# Patient Record
Sex: Female | Born: 1950 | Race: Black or African American | Hispanic: No | Smoking: Never smoker
Health system: Southern US, Community
[De-identification: ages and names within clinical notes are randomized; demographics above are authoritative.]

## PROBLEM LIST (undated history)

## (undated) DIAGNOSIS — E119 Type 2 diabetes mellitus without complications: Secondary | ICD-10-CM

## (undated) DIAGNOSIS — I10 Essential (primary) hypertension: Secondary | ICD-10-CM

---

## 2018-08-08 ENCOUNTER — Encounter (HOSPITAL_BASED_OUTPATIENT_CLINIC_OR_DEPARTMENT_OTHER): Payer: Self-pay | Admitting: *Deleted

## 2018-08-08 ENCOUNTER — Other Ambulatory Visit: Payer: Self-pay

## 2018-08-08 ENCOUNTER — Emergency Department (HOSPITAL_BASED_OUTPATIENT_CLINIC_OR_DEPARTMENT_OTHER)
Admission: EM | Admit: 2018-08-08 | Discharge: 2018-08-08 | Disposition: A | Discharge status: Home / Self Care | Payer: Medicare Other | Attending: Emergency Medicine | Admitting: Emergency Medicine

## 2018-08-08 DIAGNOSIS — R1013 Epigastric pain: Secondary | ICD-10-CM | POA: Diagnosis not present

## 2018-08-08 DIAGNOSIS — E119 Type 2 diabetes mellitus without complications: Secondary | ICD-10-CM | POA: Insufficient documentation

## 2018-08-08 DIAGNOSIS — Z7984 Long term (current) use of oral hypoglycemic drugs: Secondary | ICD-10-CM | POA: Insufficient documentation

## 2018-08-08 DIAGNOSIS — Z79899 Other long term (current) drug therapy: Secondary | ICD-10-CM | POA: Insufficient documentation

## 2018-08-08 DIAGNOSIS — I10 Essential (primary) hypertension: Secondary | ICD-10-CM | POA: Diagnosis not present

## 2018-08-08 DIAGNOSIS — R220 Localized swelling, mass and lump, head: Secondary | ICD-10-CM | POA: Diagnosis not present

## 2018-08-08 HISTORY — DX: Type 2 diabetes mellitus without complications: E11.9

## 2018-08-08 HISTORY — DX: Essential (primary) hypertension: I10

## 2018-08-08 LAB — CBC WITH DIFFERENTIAL/PLATELET
Abs Immature Granulocytes: 0.07 10*3/uL (ref 0.00–0.07)
Basophils Absolute: 0 10*3/uL (ref 0.0–0.1)
Basophils Relative: 0 %
Eosinophils Absolute: 0.1 10*3/uL (ref 0.0–0.5)
Eosinophils Relative: 2 %
HCT: 33.4 % — ABNORMAL LOW (ref 36.0–46.0)
Hemoglobin: 11.3 g/dL — ABNORMAL LOW (ref 12.0–15.0)
Immature Granulocytes: 1 %
Lymphocytes Relative: 20 %
Lymphs Abs: 1.2 10*3/uL (ref 0.7–4.0)
MCH: 27.1 pg (ref 26.0–34.0)
MCHC: 33.8 g/dL (ref 30.0–36.0)
MCV: 80.1 fL (ref 80.0–100.0)
Monocytes Absolute: 0.7 10*3/uL (ref 0.1–1.0)
Monocytes Relative: 11 %
Neutro Abs: 4.3 10*3/uL (ref 1.7–7.7)
Neutrophils Relative %: 66 %
Platelets: 704 10*3/uL — ABNORMAL HIGH (ref 150–400)
RBC: 4.17 MIL/uL (ref 3.87–5.11)
RDW: 11.7 % (ref 11.5–15.5)
WBC: 6.4 10*3/uL (ref 4.0–10.5)
nRBC: 0 % (ref 0.0–0.2)

## 2018-08-08 LAB — COMPREHENSIVE METABOLIC PANEL
ALT: 29 U/L (ref 0–44)
AST: 25 U/L (ref 15–41)
Albumin: 3.5 g/dL (ref 3.5–5.0)
Alkaline Phosphatase: 134 U/L — ABNORMAL HIGH (ref 38–126)
Anion gap: 14 (ref 5–15)
BUN: 28 mg/dL — ABNORMAL HIGH (ref 8–23)
CO2: 20 mmol/L — ABNORMAL LOW (ref 22–32)
Calcium: 9 mg/dL (ref 8.9–10.3)
Chloride: 98 mmol/L (ref 98–111)
Creatinine, Ser: 1.37 mg/dL — ABNORMAL HIGH (ref 0.44–1.00)
GFR calc Af Amer: 46 mL/min — ABNORMAL LOW (ref >60)
GFR calc non Af Amer: 40 mL/min — ABNORMAL LOW (ref >60)
Glucose, Bld: 126 mg/dL — ABNORMAL HIGH (ref 70–99)
Potassium: 3.2 mmol/L — ABNORMAL LOW (ref 3.5–5.1)
Sodium: 132 mmol/L — ABNORMAL LOW (ref 135–145)
Total Bilirubin: 0.6 mg/dL (ref 0.3–1.2)
Total Protein: 7.8 g/dL (ref 6.5–8.1)

## 2018-08-08 LAB — LIPASE, BLOOD: Lipase: 42 U/L (ref 11–51)

## 2018-08-08 MED ORDER — POTASSIUM CHLORIDE CRYS ER 20 MEQ PO TBCR
40.0000 meq | EXTENDED_RELEASE_TABLET | Freq: Once | ORAL | Status: AC
  Administered 2018-08-08: 21:00:00 40 meq via ORAL
  Filled 2018-08-08: qty 2

## 2018-08-08 MED ORDER — DIPHENHYDRAMINE HCL 25 MG PO CAPS
25.0000 mg | ORAL_CAPSULE | Freq: Once | ORAL | Status: AC
  Administered 2018-08-08: 19:00:00 25 mg via ORAL
  Filled 2018-08-08: qty 1

## 2018-08-08 MED ORDER — ALUM & MAG HYDROXIDE-SIMETH 200-200-20 MG/5ML PO SUSP
30.0000 mL | Freq: Once | ORAL | Status: AC
  Administered 2018-08-08: 20:00:00 30 mL via ORAL
  Filled 2018-08-08: qty 30

## 2018-08-08 MED ORDER — LIDOCAINE VISCOUS HCL 2 % MT SOLN
15.0000 mL | Freq: Once | OROMUCOSAL | Status: AC
  Administered 2018-08-08: 15 mL via ORAL
  Filled 2018-08-08: qty 15

## 2018-08-08 NOTE — Discharge Instructions (Addendum)
You may take Benadryl for the swelling.  If you develop trouble breathing, inability to swallow, swelling of your tongue, or any other new/concerning symptoms then return to the ER for evaluation.  IMPORTANT PATIENT INSTRUCTIONS:  You have been scheduled for an Outpatient Ultrasound.    Your appointment has been scheduled for:  _______ am/pm on _______________ (date).  If your appointment is scheduled for a Saturday, Sunday or holiday, please go to the [facility] Emergency Department Registration Desk at least 15 minutes prior to your appointment time and tell them you are there for an ultrasound.    If your appointment is scheduled for a weekday (Monday - Friday), please go directly to the [facility] Radiology Department reception area at least 15 minutes prior to your appointment time and tell them you are there for an ultrasound.    Please call [phone] with questions.

## 2018-08-08 NOTE — ED Provider Notes (Addendum)
[facility] EMERGENCY DEPARTMENT Provider Note   CSN: [id number] Arrival date & time: 08/08/18  1833    History   Chief Complaint No chief complaint on file.   HPI Denise House is a 68 y.o. female.     HPI  68 year old female presents with facial swelling starting this morning.  She states that when she first woke up it felt like her face is swollen and has moved into her lips.  There is no tongue swelling.  No trouble breathing.  No rash anywhere.  She feels like it is hard to swallow and is worried about her thyroid.  She states she is had some thyroid swelling on the left side and has seen a physician about this but told there would be no indication for surgery unless it got worse or cause trouble swallowing.  The patient states that she has been trying to start her reflux medicine but just picked it up a couple days ago.  She is not sure if this is medication related.  She is been having a bad GERD over the last 1 week since she ate onions and hamburger.  She states she is getting a lot of epigastric pain that radiates to her chest and feels like a burning.  No chest pain or shortness of breath.  No vomiting.  Past Medical History:  Diagnosis Date  . Diabetes mellitus without complication (HCC)   . Hypertension     There are no active problems to display for this patient.   History reviewed. No pertinent surgical history.   OB History   No obstetric history on file.      Home Medications    Prior to Admission medications   Medication Sig Start Date End Date Taking? Authorizing Provider  atorvastatin (LIPITOR) 20 MG tablet TK 1 T PO QHS 08/05/18  Yes [provider]  esomeprazole (NEXIUM) 20 MG capsule TK ONE C PO QD AT LEAST 1 HOUR AC SWALLOWING WHOLE. DO NOT CRUSH OR CHEW GRANULES. 07/29/18  Yes [provider]  esomeprazole (NEXIUM) 40 MG capsule  08/06/18  Yes [provider]  fluticasone (FLONASE) 50 MCG/ACT nasal spray SHAKE  LQ AND U 1 SPR IEN BID 07/29/18  Yes [provider]  lisinopril-hydrochlorothiazide (ZESTORETIC) 20-25 MG tablet TK 1 T PO QD 07/09/18  Yes [provider]  metFORMIN (GLUCOPHAGE) 500 MG tablet TK 1 T PO BID WITH MORNING AND EVENING MEALS 08/05/18  Yes [provider]  PROAIR HFA 108 (90 Base) MCG/ACT inhaler INL 2 PFS PO Q 4 H PRN 07/09/18  Yes [provider]    Family History No family history on file.  Social History Social History   Tobacco Use  . Smoking status: Never Smoker  . Smokeless tobacco: Never Used  Substance Use Topics  . Alcohol use: Never    Frequency: Never  . Drug use: Never     Allergies   Aspirin; Codeine; and Sulfa antibiotics   Review of Systems Review of Systems  Constitutional: Negative for fever.  HENT: Positive for facial swelling and trouble swallowing. Negative for sore throat.   Respiratory: Negative for shortness of breath.   Cardiovascular: Negative for chest pain.  Gastrointestinal: Positive for abdominal pain. Negative for nausea and vomiting.  All other systems reviewed and are negative.    Physical Exam Updated Vital Signs BP 128/62 (BP Location: Left Arm)   Pulse 84   Temp 98.2 F (36.8 C) (Oral)   Resp 16  Ht 5\' 2"  (1.575 m)   Wt 69.9 kg   SpO2 100%   BMI 28.17 kg/m   Physical Exam Vitals signs and nursing note reviewed.  Constitutional:      General: She is not in acute distress.    Appearance: She is well-developed. She is not ill-appearing or diaphoretic.  HENT:     Head: Normocephalic and atraumatic.     Right Ear: External ear normal.     Left Ear: External ear normal.     Nose: Nose normal.     Mouth/Throat:     Pharynx: No oropharyngeal exudate or posterior oropharyngeal erythema.     Comments: Uvula midline, no swelling. No tongue swelling. Normal phonation Perhaps mild facial swelling of lips and cheeks/chin. No skin color changes Eyes:     General:        Right eye: No  discharge.        Left eye: No discharge.  Cardiovascular:     Rate and Rhythm: Normal rate and regular rhythm.     Heart sounds: Normal heart sounds.  Pulmonary:     Effort: Pulmonary effort is normal.     Breath sounds: Normal breath sounds. No stridor. No wheezing.  Abdominal:     Palpations: Abdomen is soft.     Tenderness: There is abdominal tenderness in the epigastric area.  Skin:    General: Skin is warm and dry.  Neurological:     Mental Status: She is alert.  Psychiatric:        Mood and Affect: Mood is not anxious.      ED Treatments / Results  Labs (all labs ordered are listed, but only abnormal results are displayed) Labs Reviewed  COMPREHENSIVE METABOLIC PANEL - Abnormal; Notable for the following components:      Result Value   Sodium 132 (*)    Potassium 3.2 (*)    CO2 20 (*)    Glucose, Bld 126 (*)    BUN 28 (*)    Creatinine, Ser 1.37 (*)    Alkaline Phosphatase 134 (*)    GFR calc non Af Amer 40 (*)    GFR calc Af Amer 46 (*)    All other components within normal limits  CBC WITH DIFFERENTIAL/PLATELET - Abnormal; Notable for the following components:   Hemoglobin 11.3 (*)    HCT 33.4 (*)    Platelets 704 (*)    All other components within normal limits  LIPASE, BLOOD    EKG EKG Interpretation  Date/Time:  Thursday August 08 2018 19:32:24 EDT Ventricular Rate:  87 PR Interval:    QRS Duration: 95 QT Interval:  372 QTC Calculation: 448 R Axis:   34 Text Interpretation:  Sinus rhythm RSR' in V1 or V2, probably normal variant no acute ST/T changes No old tracing to compare Confirmed by Pricilla Loveless [phone]) on 08/08/2018 7:42:42 PM   Radiology No results found.  Procedures Procedures (including critical care time)  Medications Ordered in ED Medications  diphenhydrAMINE (BENADRYL) capsule 25 mg (25 mg Oral Given 08/08/18 1929)  alum & mag hydroxide-simeth (MAALOX/MYLANTA) 200-200-20 MG/5ML suspension 30 mL (30 mLs Oral Given 08/08/18  1930)    And  lidocaine (XYLOCAINE) 2 % viscous mouth solution 15 mL (15 mLs Oral Given 08/08/18 1930)  potassium chloride SA (K-DUR) CR tablet 40 mEq (40 mEq Oral Given 08/08/18 2043)     Initial Impression / Assessment and Plan / ED Course  I have reviewed the triage vital signs  and the nursing notes.  Pertinent labs & imaging results that were available during my care of the patient were reviewed by me and considered in my medical decision making (see chart for details).        Unclear cause of the patient's mild facial swelling.  There does not appear to be an intraoral or dental infection.  This could be allergic but she does not appear to have anaphylaxis at this time.  She is able to swallow both liquid medicine and potassium pills without difficulty.  As for her abdominal pain given the complaint of probably is reflux.  She is feeling better at this time.  Minimal epigastric tenderness.  We do not have an ultrasound now but I think is reasonable to wait until tomorrow for outpatient ultrasound.  I highly doubt she needs an emergent CT scan as I have low suspicion for an acute intra-abdominal emergency.  Otherwise, doubt atypical cardiac cause.  Will discharge home with return precautions.  Use Benadryl as needed for the swelling. This doesn't really look like angioedema, but could be atypical presentation. Will have her stop her lisinopril for now.  Final Clinical Impressions(s) / ED Diagnoses   Final diagnoses:  Facial swelling  Epigastric abdominal pain    ED Discharge Orders         Ordered    US Abdomen Limited RUQ/Gall Bladder     08/08/18 1929           Pricilla Loveless, MD 08/08/18 4696    Pricilla Loveless, MD 08/08/18 2111

## 2018-08-08 NOTE — ED Triage Notes (Addendum)
Last week she was started on Nexium. She took it a week and reflux was not improving so it was stopped and she was started on Atorvastatin for reflux per pt. She woke this am with facial swelling and her thyroid was bothering her. Looking through her medication list from her MD, she was started on Lisinopril a month ago.

## 2018-08-09 ENCOUNTER — Ambulatory Visit (HOSPITAL_BASED_OUTPATIENT_CLINIC_OR_DEPARTMENT_OTHER)
Admission: RE | Admit: 2018-08-09 | Discharge: 2018-08-09 | Disposition: A | Discharge status: Home / Self Care | Payer: Medicare Other | Source: Ambulatory Visit | Attending: Emergency Medicine | Admitting: Emergency Medicine

## 2018-08-09 DIAGNOSIS — R1013 Epigastric pain: Secondary | ICD-10-CM | POA: Insufficient documentation

## 2018-08-09 NOTE — ED Provider Notes (Signed)
Right upper quadrant ultrasound showed no acute abnormalities.  Patient is starting an antacid medication and will follow-up with her PCP.   Rolan Bucco, MD 08/09/18 [phone]

## 2020-05-17 IMAGING — US ULTRASOUND ABDOMEN LIMITED
1 series · 14 of 25 positions shown · non-contrast
Comparison: None.

CLINICAL DATA: Epigastric abdominal pain and tenderness over the
last week.

EXAM:
ULTRASOUND ABDOMEN LIMITED RIGHT UPPER QUADRANT

[Series 1: ultrasound abdomen limited · 14 of 77 slices shown]
[im 1/77]
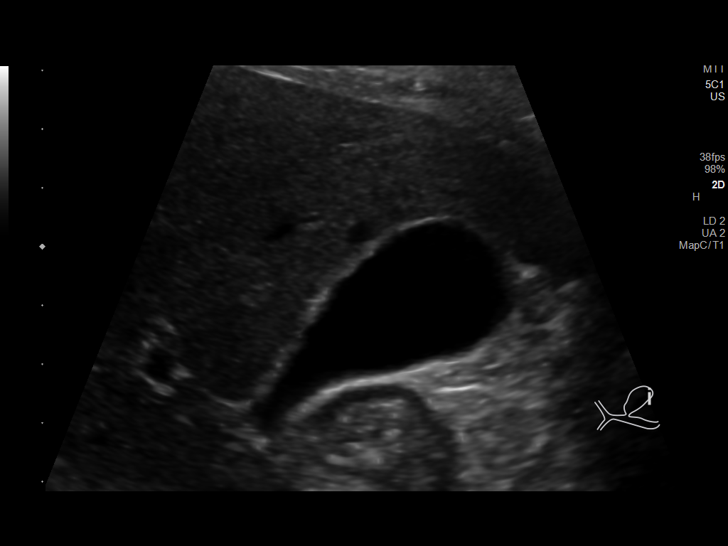
[im 7/77]
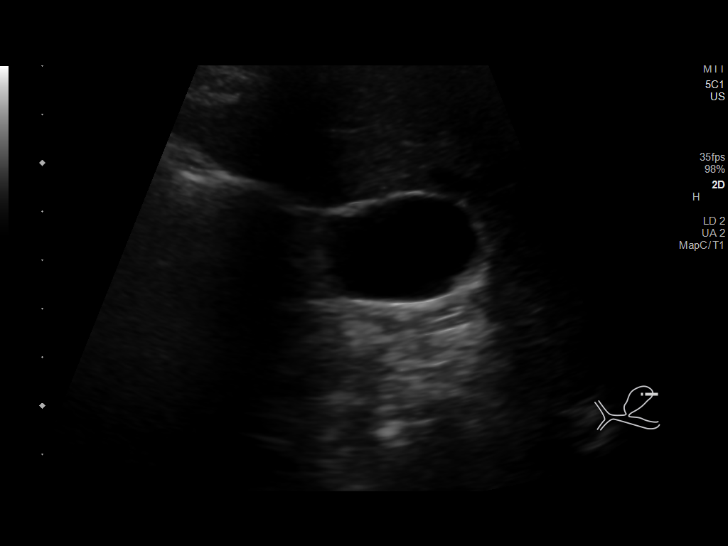
[im 13/77]
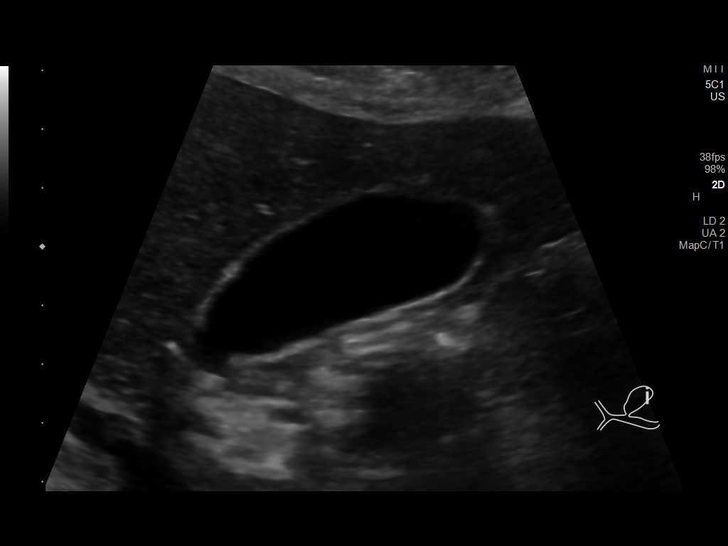
[im 20/77]
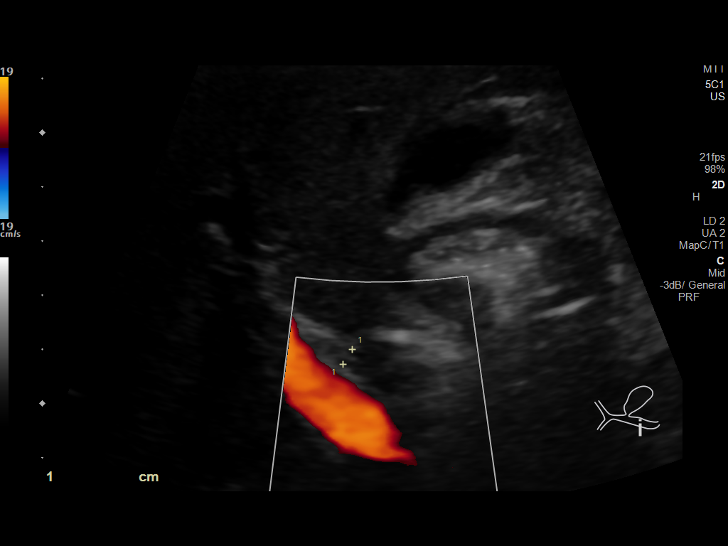
[im 26/77]
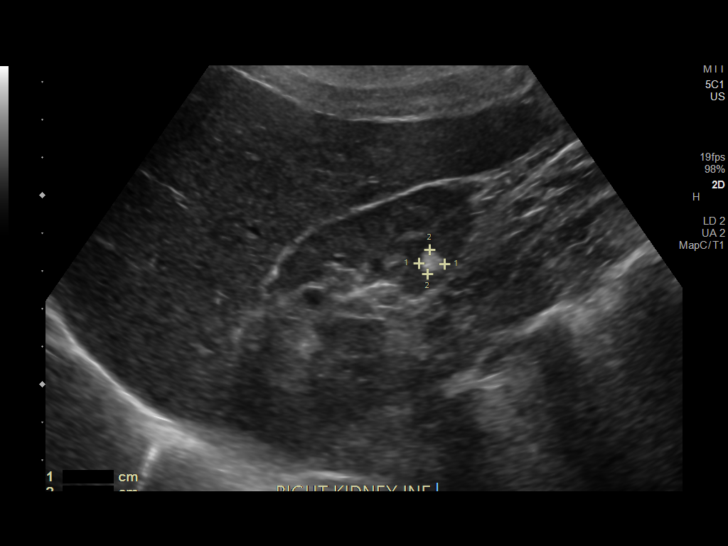
[im 29/77]
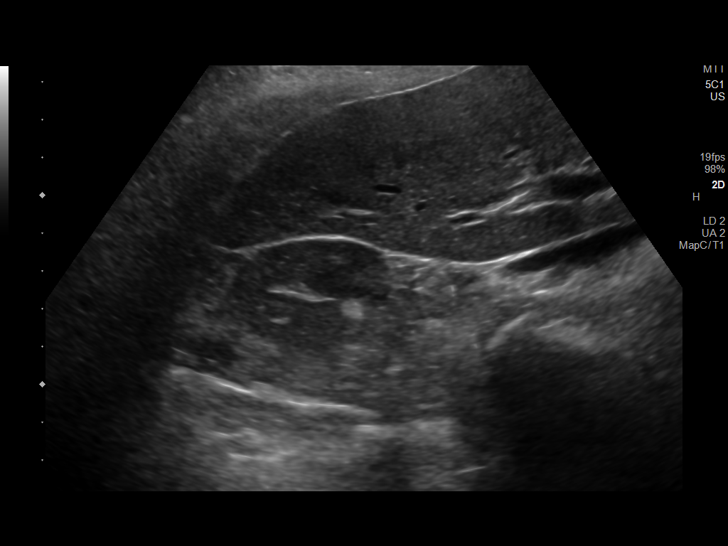
[im 35/77]
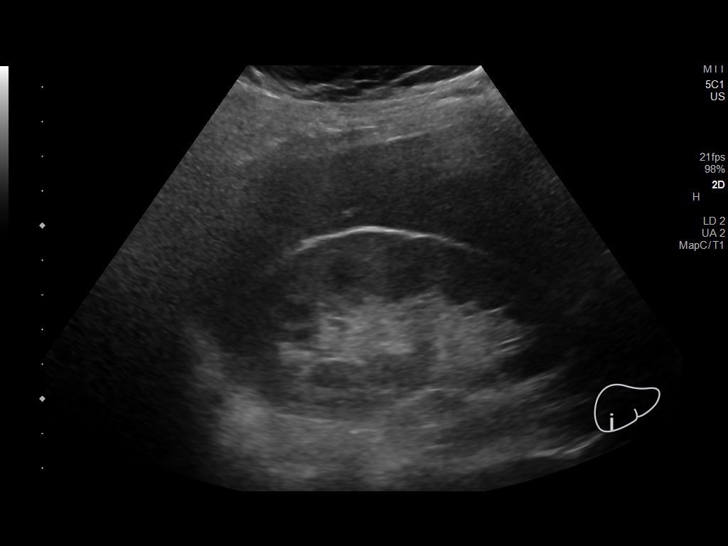
[im 42/77]
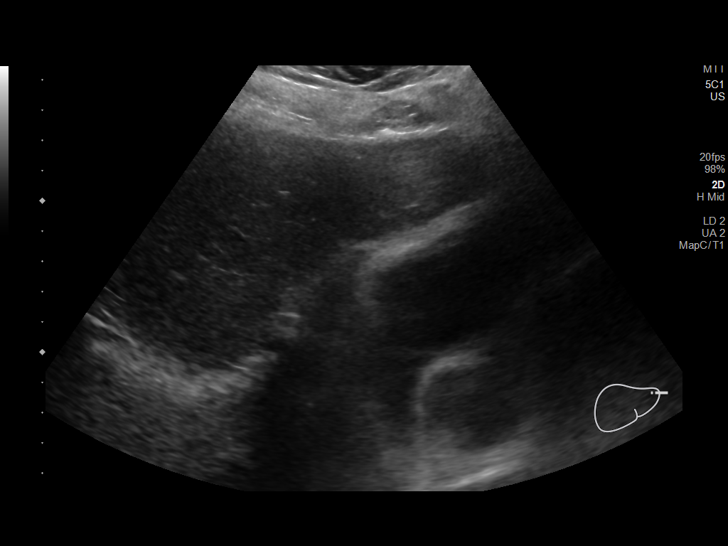
[im 48/77]
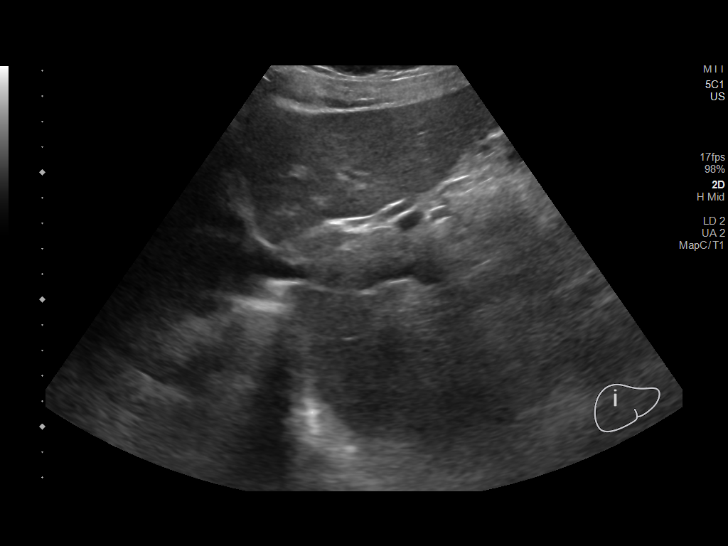
[im 51/77]
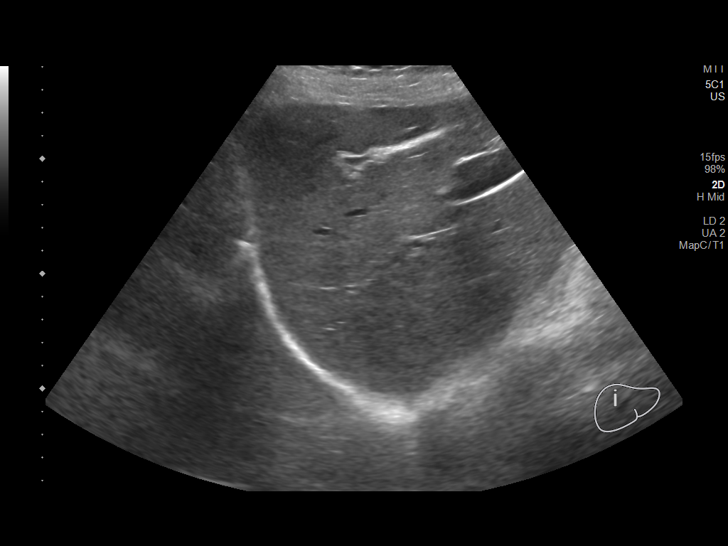
[im 58/77]
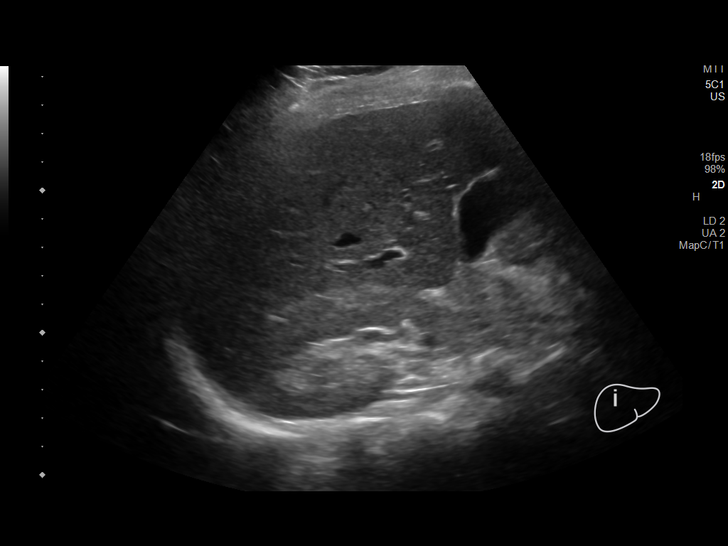
[im 64/77]
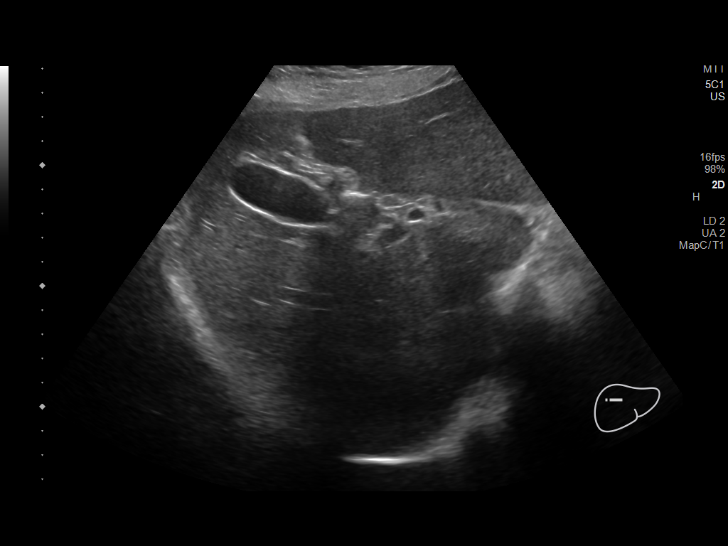
[im 70/77]
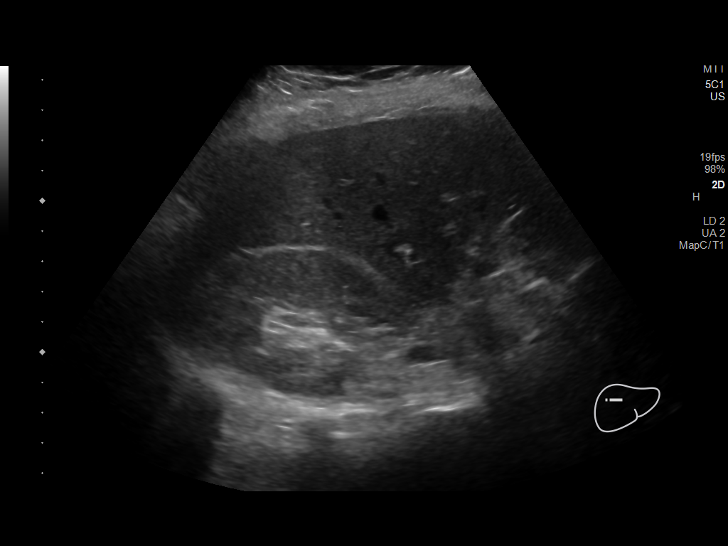
[im 77/77]
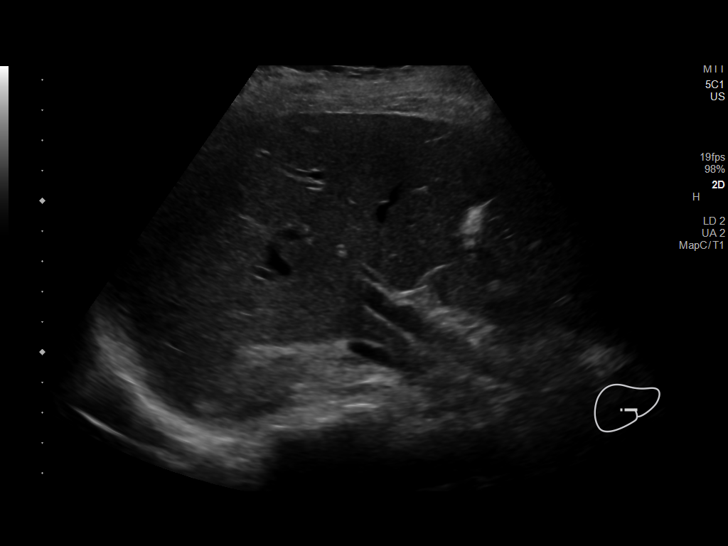

[14 of 25 positions shown; findings below may reference images not displayed]

FINDINGS: Gallbladder:

No gallstones or wall thickening visualized. No sonographic Murphy
sign noted by sonographer.

Common bile duct:

Diameter: 3 mm.  Normal.

Liver:

Normal liver echogenicity. No focal lesion. Portal vein is patent
on color Doppler imaging with normal direction of blood flow towards
the liver.

Incidental note is made of a 7 mm echogenic focus within the right
kidney probably representing an angiomyolipoma, incidental.
IMPRESSION: No abnormality seen to explain right upper quadrant or epigastric
symptoms. Negative study.
